# Patient Record
Sex: Female | Born: 1990 | Race: White | Hispanic: No | Marital: Married | Smoking: Never smoker
Health system: Southern US, Community
[De-identification: ages and names within clinical notes are randomized; demographics above are authoritative.]

## PROBLEM LIST (undated history)

## (undated) ENCOUNTER — Inpatient Hospital Stay (HOSPITAL_COMMUNITY): Payer: Self-pay

## (undated) DIAGNOSIS — Z789 Other specified health status: Secondary | ICD-10-CM

---

## 2016-11-24 NOTE — L&D Delivery Note (Signed)
Delivery Note At 4:26 AM a viable female was delivered via VBAC, Spontaneous (Presentation: OL ).  APGAR: 9, 9; weight pending .   Placenta status: Delivered intact with gentle traction.  3 vessels cord with the following complications: None .  Cord pH: not collected  Anesthesia:  Epidural Episiotomy:  None  Lacerations:  2nd degree  Suture Repair: 3.0 Monocryl  Est. Blood Loss (mL): 250 ml  Mom to postpartum.  Baby to Couplet care / Skin to Skin.  Abdoulaye Diallo 10/11/2017, 4:59 AM  I confirm that I have verified the information documented in the resident's note and that I have also personally reperformed the physical exam and all medical decision making activities.   I was gloved and present for entire delivery SVD without incident No difficulty with shoulders Lacerations as listed above Repair of same supervised by me Aviva SignsMarie L Shayne Deerman, CNM

## 2017-03-05 ENCOUNTER — Other Ambulatory Visit (HOSPITAL_COMMUNITY): Payer: Self-pay | Admitting: Nurse Practitioner

## 2017-03-05 DIAGNOSIS — Z8279 Family history of other congenital malformations, deformations and chromosomal abnormalities: Secondary | ICD-10-CM

## 2017-03-05 DIAGNOSIS — Z3481 Encounter for supervision of other normal pregnancy, first trimester: Secondary | ICD-10-CM

## 2017-03-05 LAB — OB RESULTS CONSOLE RPR: RPR: NONREACTIVE

## 2017-03-05 LAB — OB RESULTS CONSOLE GC/CHLAMYDIA
Chlamydia: NEGATIVE
Gonorrhea: NEGATIVE

## 2017-03-05 LAB — OB RESULTS CONSOLE ABO/RH: RH TYPE: POSITIVE

## 2017-03-05 LAB — OB RESULTS CONSOLE HIV ANTIBODY (ROUTINE TESTING): HIV: NONREACTIVE

## 2017-03-05 LAB — OB RESULTS CONSOLE HEPATITIS B SURFACE ANTIGEN: HEP B S AG: NEGATIVE

## 2017-03-05 LAB — OB RESULTS CONSOLE RUBELLA ANTIBODY, IGM: Rubella: IMMUNE

## 2017-03-05 LAB — OB RESULTS CONSOLE ANTIBODY SCREEN: Antibody Screen: NEGATIVE

## 2017-03-24 ENCOUNTER — Encounter (HOSPITAL_COMMUNITY): Payer: Self-pay | Admitting: *Deleted

## 2017-03-25 ENCOUNTER — Encounter (HOSPITAL_COMMUNITY): Payer: Self-pay

## 2017-03-25 ENCOUNTER — Other Ambulatory Visit (HOSPITAL_COMMUNITY): Payer: Self-pay | Admitting: Nurse Practitioner

## 2017-03-25 ENCOUNTER — Ambulatory Visit (HOSPITAL_COMMUNITY)
Admission: RE | Admit: 2017-03-25 | Discharge: 2017-03-25 | Disposition: A | Discharge status: Home / Self Care | Payer: Medicaid Other | Source: Ambulatory Visit | Attending: Nurse Practitioner | Admitting: Nurse Practitioner

## 2017-03-25 DIAGNOSIS — Z3A12 12 weeks gestation of pregnancy: Secondary | ICD-10-CM | POA: Insufficient documentation

## 2017-03-25 DIAGNOSIS — Z8279 Family history of other congenital malformations, deformations and chromosomal abnormalities: Secondary | ICD-10-CM

## 2017-03-25 DIAGNOSIS — Z3682 Encounter for antenatal screening for nuchal translucency: Secondary | ICD-10-CM

## 2017-03-25 DIAGNOSIS — O352XX0 Maternal care for (suspected) hereditary disease in fetus, not applicable or unspecified: Secondary | ICD-10-CM

## 2017-03-25 DIAGNOSIS — Z3481 Encounter for supervision of other normal pregnancy, first trimester: Secondary | ICD-10-CM | POA: Insufficient documentation

## 2017-03-25 HISTORY — DX: Other specified health status: Z78.9

## 2017-03-25 NOTE — Progress Notes (Addendum)
Genetic Counseling  Visit Summary Note  Appointment Date: 03/25/2017 Referred By: Cindy Click, NP  Date of Birth: 1990-12-08  Pregnancy history: G2P1001 Estimated Date of Delivery: 10/03/17 Estimated Gestational Age: [redacted]w[redacted]d I met with Ms. Cindy Mccullough, Cindy Mccullough for genetic counseling because of a family history of a congenital heart defect.  In summary:  Discussed family history of CHD  Reviewed multifactorial inheritance  Discussed options of screening / testing  Fetal echo to be scheduled  Anatomy ultrasound scheduled  Discussed general population carrier screening options  CF - previously performed  SMA - declined  Hemoglobinopathies - previously performed  We began by reviewing the family history in detail. Ms. Cindy Keysreported that her maternal half sister was born with a heart defect and had surgery when she was a few months old.  She also had something white on her eye and had surgery when she was 86942years of age, but cannot see out of that eye.  Ms. Cindy Keyswas unclear exactly what was different about her sister's eye or if it was congenital.  There is no other family history of a congenital heart defect (CHD) or other eye conditions.  We reviewed that CHDs can be isolated or a feature of an underlying genetic condition. Isolated CHDs are most often multifactorial in etiology, but can also result from chromosome aberrations, single gene conditions, or teratogenic exposures. We discussed that isolated, nonsyndromic CHDs occur in approximately 1% of the general population.  If Ms. Cindy Mccullough's half-sister had an isolated CHD, the risk of recurrence is expected to be no greater than 1-2%. If however, her half-sister had an underlying genetic condition that caused the CHD, the risk of recurrence could be increased. In this case, the specific chance of recurrence depends upon the inheritance of the condition. Without further information,  an accurate risk assessment cannot be provided. We discussed the availability of a detailed anatomy ultrasound and fetal echocardiogram to assess the development of the heart during the pregnancy.  These appointments will be scheduled.   The family histories were otherwise found to be noncontributory for birth defects, mental retardation, and known genetic conditions. Without further information regarding the provided family history, an accurate genetic risk cannot be calculated. Further genetic counseling is warranted if more information is obtained.  Ms. PShamarie Callwas provided with written information regarding cystic fibrosis (CF), spinal muscular atrophy (SMA) and hemoglobinopathies including the carrier frequency, availability of carrier screening and prenatal diagnosis if indicated.  In addition, we discussed that CF, SMA and hemoglobinopathies are routinely screened for as part of the [facility] newborn screening panel.  She was counseled that she has previously undergone screening for CF and hemoglobin disorders and it was reported to be within normal limites.  She declined screening for SMA today.  Ms. Cindy Keysdenied exposure to environmental toxins or chemical agents. She denied the use of alcohol, tobacco or street drugs. She denied significant viral illnesses during the course of her pregnancy. Her medical and surgical histories were noncontributory.   I counseled this couple regarding the above risks and available options.  The approximate face-to-face time with the genetic counselor was 30 minutes.  DCam Hai MS Certified Genetic Counselor

## 2017-03-26 ENCOUNTER — Other Ambulatory Visit (HOSPITAL_COMMUNITY): Payer: Self-pay | Admitting: *Deleted

## 2017-03-26 DIAGNOSIS — Z8279 Family history of other congenital malformations, deformations and chromosomal abnormalities: Secondary | ICD-10-CM

## 2017-03-27 ENCOUNTER — Other Ambulatory Visit: Payer: Self-pay

## 2017-05-07 ENCOUNTER — Encounter (HOSPITAL_COMMUNITY): Payer: Self-pay

## 2017-05-11 ENCOUNTER — Ambulatory Visit (HOSPITAL_COMMUNITY)
Admission: RE | Admit: 2017-05-11 | Discharge: 2017-05-11 | Disposition: A | Discharge status: Home / Self Care | Payer: Medicaid Other | Source: Ambulatory Visit | Attending: Nurse Practitioner | Admitting: Nurse Practitioner

## 2017-05-11 ENCOUNTER — Encounter (HOSPITAL_COMMUNITY): Payer: Self-pay

## 2017-05-11 DIAGNOSIS — Z3A19 19 weeks gestation of pregnancy: Secondary | ICD-10-CM | POA: Diagnosis not present

## 2017-05-11 DIAGNOSIS — Z8279 Family history of other congenital malformations, deformations and chromosomal abnormalities: Secondary | ICD-10-CM | POA: Diagnosis not present

## 2017-06-04 ENCOUNTER — Encounter (HOSPITAL_COMMUNITY): Payer: Self-pay

## 2017-07-27 ENCOUNTER — Inpatient Hospital Stay (HOSPITAL_COMMUNITY)
Admission: AD | Admit: 2017-07-27 | Discharge: 2017-07-28 | Disposition: A | Discharge status: Home / Self Care | Payer: Medicaid Other | Source: Ambulatory Visit | Attending: Obstetrics & Gynecology | Admitting: Obstetrics & Gynecology

## 2017-07-27 ENCOUNTER — Encounter (HOSPITAL_COMMUNITY): Payer: Self-pay

## 2017-07-27 DIAGNOSIS — R102 Pelvic and perineal pain: Secondary | ICD-10-CM | POA: Insufficient documentation

## 2017-07-27 DIAGNOSIS — N949 Unspecified condition associated with female genital organs and menstrual cycle: Secondary | ICD-10-CM

## 2017-07-27 DIAGNOSIS — R1030 Lower abdominal pain, unspecified: Secondary | ICD-10-CM | POA: Diagnosis not present

## 2017-07-27 DIAGNOSIS — O26893 Other specified pregnancy related conditions, third trimester: Secondary | ICD-10-CM | POA: Insufficient documentation

## 2017-07-27 DIAGNOSIS — R109 Unspecified abdominal pain: Secondary | ICD-10-CM | POA: Diagnosis not present

## 2017-07-27 DIAGNOSIS — Z3A30 30 weeks gestation of pregnancy: Secondary | ICD-10-CM | POA: Diagnosis not present

## 2017-07-27 DIAGNOSIS — O26899 Other specified pregnancy related conditions, unspecified trimester: Secondary | ICD-10-CM

## 2017-07-27 LAB — URINALYSIS, ROUTINE W REFLEX MICROSCOPIC
BILIRUBIN URINE: NEGATIVE
GLUCOSE, UA: NEGATIVE mg/dL
HGB URINE DIPSTICK: NEGATIVE
KETONES UR: NEGATIVE mg/dL
LEUKOCYTES UA: NEGATIVE
Nitrite: NEGATIVE
PH: 6 (ref 5.0–8.0)
Protein, ur: NEGATIVE mg/dL
Specific Gravity, Urine: 1.003 — ABNORMAL LOW (ref 1.005–1.030)

## 2017-07-27 MED ORDER — ACETAMINOPHEN 500 MG PO TABS
500.0000 mg | ORAL_TABLET | Freq: Once | ORAL | Status: AC
  Administered 2017-07-27: 500 mg via ORAL
  Filled 2017-07-27: qty 1

## 2017-07-27 NOTE — MAU Note (Signed)
Pt states 1 day ago while she was sleeping she "felt like something exploded" in left side of stomach. States that pain went away but tonight it happened again. Pt states she has the pain and it "feels like something is scratching on the inside." Pt reports good fetal movement. Pt denies vaginal bleeding or discharge.

## 2017-07-27 NOTE — MAU Provider Note (Signed)
Chief Complaint:  Abdominal Pain   None     HPI: Cindy Mccullough is a 26 y.o. G2P1001 at 3341w2d who presents to MAU reporting bilateral lower abdominal pain with L>R. Patient states that it feels like something on the inside is bursting open and hurts. She felt a pop two nights ago and then again today. Pain last for about 2 minutes. When it came back tonight it was after she sneezed and has been hurting ever since. Will eventually go away. She has not tried anything for pain. Pain currently 5/10. Pain described as sharp and stabbing. Does have some radiation to flank area.   Denies contractions, leakage of fluid, vaginal discharge, or vaginal bleeding. Good fetal movement.   Pregnancy Course:  [facility]  Past Medical History: Past Medical History:  Diagnosis Date  . Medical history non-contributory     Past obstetric history: OB History  Gravida Para Term Preterm AB Living  2 1 1     1   SAB TAB Ectopic Multiple Live Births               # Outcome Date GA Lbr Len/2nd Weight Sex Delivery Anes PTL Lv  2 Current           1 Term               Past Surgical History: Past Surgical History:  Procedure Laterality Date  . CESAREAN SECTION       Family History: No family history on file.  Social History: Social History  Substance Use Topics  . Smoking status: Never Smoker  . Smokeless tobacco: Never Used  . Alcohol use No    Allergies:  Allergies  Allergen Reactions  . Soy Allergy Anaphylaxis    Difficulty breathing and eyes swell    Meds:  Prescriptions Prior to Admission  Medication Sig Dispense Refill Last Dose  . Prenatal Vit-Fe Fumarate-FA (PRENATAL MULTIVITAMIN) TABS tablet Take 1 tablet by mouth daily at 12 noon.   Taking    I have reviewed patient's Past Medical Hx, Surgical Hx, Family Hx, Social Hx, medications and allergies.   ROS:  All systems reviewed and are negative for acute change except as noted in the  HPI.   Physical Exam  Patient Vitals for the past 24 hrs:  BP Temp Temp src Pulse Resp SpO2 Height Weight  07/27/17 2223 134/82 98.2 F (36.8 C) Oral 88 19 98 % 5\' 3"  (1.6 m) 218 lb (98.9 kg)   Constitutional: Well-developed, well-nourished female in no acute distress.  Cardiovascular: normal rate and rhythm Respiratory: normal effort GI: Abd soft, non-tender, gravid appropriate for gestational age. MS: Extremities nontender, no edema, normal ROM Neurologic: Alert and oriented x 4.  GU: Neg CVAT. Pelvic: defered   Labs: Results for orders placed or performed during the hospital encounter of 07/27/17 (from the past 72 hour(s))  Urinalysis, Routine w reflex microscopic     Status: Abnormal   Collection Time: 07/27/17 10:19 PM  Result Value Ref Range   Color, Urine STRAW (A) YELLOW   APPearance CLEAR CLEAR   Specific Gravity, Urine 1.003 (L) 1.005 - 1.030   pH 6.0 5.0 - 8.0   Glucose, UA NEGATIVE NEGATIVE mg/dL   Hgb urine dipstick NEGATIVE NEGATIVE   Bilirubin Urine NEGATIVE NEGATIVE   Ketones, ur NEGATIVE NEGATIVE mg/dL   Protein, ur NEGATIVE NEGATIVE mg/dL   Nitrite NEGATIVE NEGATIVE   Leukocytes, UA NEGATIVE NEGATIVE  Imaging:  No results found.  MAU Course: Vitals and nursing note reviewed  UA unremarkable Tylenol given for pain  I personally reviewed the patient's NST today, found to be REACTIVE. 135 bpm, mod var, +accels, no decels. CTX: none   MDM: Plan of care reviewed with patient, including labs and tests ordered and medical treatment.   Assessment: 1. Round ligament pain   2. Abdominal pain affecting pregnancy     Plan: Discharge home in stable condition.  Instructions on RLP given Tylenol prn for pain Rx for pregnancy support band given Preterm labor precautions and fetal kick counts Follow-up with OB provider   Caryl Ada, DO [facility] for [facility], [facility] 07/27/2017 10:41 PM

## 2017-07-27 NOTE — Discharge Instructions (Signed)

## 2017-07-30 LAB — OB RESULTS CONSOLE RPR: RPR: REACTIVE

## 2017-08-04 ENCOUNTER — Encounter (HOSPITAL_COMMUNITY): Payer: Self-pay

## 2017-09-10 LAB — OB RESULTS CONSOLE GC/CHLAMYDIA
Chlamydia: NEGATIVE
GC PROBE AMP, GENITAL: NEGATIVE

## 2017-09-10 LAB — OB RESULTS CONSOLE GBS: GBS: NEGATIVE

## 2017-09-10 LAB — OB RESULTS CONSOLE RPR: RPR: REACTIVE

## 2017-10-06 ENCOUNTER — Encounter (HOSPITAL_COMMUNITY): Payer: Self-pay | Admitting: *Deleted

## 2017-10-06 ENCOUNTER — Telehealth (HOSPITAL_COMMUNITY): Payer: Self-pay | Admitting: *Deleted

## 2017-10-06 NOTE — Telephone Encounter (Signed)
Preadmission screen [phone] interpreter number

## 2017-10-10 ENCOUNTER — Other Ambulatory Visit: Payer: Self-pay

## 2017-10-10 ENCOUNTER — Inpatient Hospital Stay (HOSPITAL_COMMUNITY)
Admission: RE | Admit: 2017-10-10 | Discharge: 2017-10-13 | DRG: 798 | Disposition: A | Discharge status: Home / Self Care | Payer: Medicaid Other | Source: Ambulatory Visit | Attending: Obstetrics and Gynecology | Admitting: Obstetrics and Gynecology

## 2017-10-10 ENCOUNTER — Encounter (HOSPITAL_COMMUNITY): Payer: Self-pay

## 2017-10-10 ENCOUNTER — Inpatient Hospital Stay (HOSPITAL_COMMUNITY): Payer: Medicaid Other | Admitting: Anesthesiology

## 2017-10-10 ENCOUNTER — Encounter (HOSPITAL_COMMUNITY): Payer: Self-pay | Admitting: Anesthesiology

## 2017-10-10 DIAGNOSIS — Z3A41 41 weeks gestation of pregnancy: Secondary | ICD-10-CM | POA: Diagnosis not present

## 2017-10-10 DIAGNOSIS — Z302 Encounter for sterilization: Secondary | ICD-10-CM

## 2017-10-10 DIAGNOSIS — O34219 Maternal care for unspecified type scar from previous cesarean delivery: Secondary | ICD-10-CM | POA: Diagnosis present

## 2017-10-10 DIAGNOSIS — O99214 Obesity complicating childbirth: Secondary | ICD-10-CM | POA: Diagnosis present

## 2017-10-10 DIAGNOSIS — O480 Post-term pregnancy: Principal | ICD-10-CM | POA: Diagnosis present

## 2017-10-10 LAB — TYPE AND SCREEN
ABO/RH(D): B POS
ANTIBODY SCREEN: NEGATIVE

## 2017-10-10 LAB — CBC
HEMATOCRIT: 36.9 % (ref 36.0–46.0)
HEMOGLOBIN: 12.2 g/dL (ref 12.0–15.0)
MCH: 28.8 pg (ref 26.0–34.0)
MCHC: 33.1 g/dL (ref 30.0–36.0)
MCV: 87 fL (ref 78.0–100.0)
Platelets: 258 10*3/uL (ref 150–400)
RBC: 4.24 MIL/uL (ref 3.87–5.11)
RDW: 14.3 % (ref 11.5–15.5)
WBC: 7.1 10*3/uL (ref 4.0–10.5)

## 2017-10-10 LAB — ABO/RH: ABO/RH(D): B POS

## 2017-10-10 MED ORDER — LACTATED RINGERS IV SOLN
500.0000 mL | Freq: Once | INTRAVENOUS | Status: AC
  Administered 2017-10-11: 500 mL via INTRAVENOUS

## 2017-10-10 MED ORDER — PHENYLEPHRINE 40 MCG/ML (10ML) SYRINGE FOR IV PUSH (FOR BLOOD PRESSURE SUPPORT)
80.0000 ug | PREFILLED_SYRINGE | INTRAVENOUS | Status: DC | PRN
  Filled 2017-10-10: qty 10
  Filled 2017-10-10: qty 5

## 2017-10-10 MED ORDER — FENTANYL CITRATE (PF) 100 MCG/2ML IJ SOLN
INTRAMUSCULAR | Status: AC
  Filled 2017-10-10: qty 2

## 2017-10-10 MED ORDER — TERBUTALINE SULFATE 1 MG/ML IJ SOLN
0.2500 mg | Freq: Once | INTRAMUSCULAR | Status: DC | PRN
  Filled 2017-10-10: qty 1

## 2017-10-10 MED ORDER — OXYCODONE-ACETAMINOPHEN 5-325 MG PO TABS
2.0000 | ORAL_TABLET | ORAL | Status: DC | PRN
  Administered 2017-10-12: 2 via ORAL
  Filled 2017-10-10: qty 2

## 2017-10-10 MED ORDER — ONDANSETRON HCL 4 MG/2ML IJ SOLN: 4.0000 mg | Freq: Four times a day (QID) | INTRAMUSCULAR | Status: DC | PRN

## 2017-10-10 MED ORDER — DIPHENHYDRAMINE HCL 50 MG/ML IJ SOLN: 12.5000 mg | INTRAMUSCULAR | Status: DC | PRN

## 2017-10-10 MED ORDER — OXYTOCIN BOLUS FROM INFUSION
500.0000 mL | Freq: Once | INTRAVENOUS | Status: AC
  Administered 2017-10-11: 500 mL via INTRAVENOUS

## 2017-10-10 MED ORDER — OXYTOCIN 40 UNITS IN LACTATED RINGERS INFUSION - SIMPLE MED
1.0000 m[IU]/min | INTRAVENOUS | Status: DC
  Administered 2017-10-10: 1 m[IU]/min via INTRAVENOUS
  Filled 2017-10-10: qty 1000

## 2017-10-10 MED ORDER — OXYTOCIN 40 UNITS IN LACTATED RINGERS INFUSION - SIMPLE MED: 2.5000 [IU]/h | INTRAVENOUS | Status: DC

## 2017-10-10 MED ORDER — LACTATED RINGERS IV SOLN: 500.0000 mL | INTRAVENOUS | Status: DC | PRN

## 2017-10-10 MED ORDER — LACTATED RINGERS IV SOLN
INTRAVENOUS | Status: DC
  Administered 2017-10-10 (×3): via INTRAVENOUS

## 2017-10-10 MED ORDER — FENTANYL CITRATE (PF) 100 MCG/2ML IJ SOLN
100.0000 ug | INTRAMUSCULAR | Status: DC | PRN
  Administered 2017-10-10 (×3): 100 ug via INTRAVENOUS
  Filled 2017-10-10 (×2): qty 2

## 2017-10-10 MED ORDER — LIDOCAINE HCL (PF) 1 % IJ SOLN
INTRAMUSCULAR | Status: DC | PRN
  Administered 2017-10-10: 4 mL via EPIDURAL

## 2017-10-10 MED ORDER — EPHEDRINE 5 MG/ML INJ
10.0000 mg | INTRAVENOUS | Status: DC | PRN
  Filled 2017-10-10: qty 2

## 2017-10-10 MED ORDER — LIDOCAINE HCL (PF) 1 % IJ SOLN
30.0000 mL | INTRAMUSCULAR | Status: DC | PRN
  Filled 2017-10-10: qty 30

## 2017-10-10 MED ORDER — SOD CITRATE-CITRIC ACID 500-334 MG/5ML PO SOLN: 30.0000 mL | ORAL | Status: DC | PRN

## 2017-10-10 MED ORDER — LACTATED RINGERS IV SOLN
500.0000 mL | Freq: Once | INTRAVENOUS | Status: AC
  Administered 2017-10-10: 500 mL via INTRAVENOUS

## 2017-10-10 MED ORDER — ACETAMINOPHEN 325 MG PO TABS
650.0000 mg | ORAL_TABLET | ORAL | Status: DC | PRN
  Filled 2017-10-10: qty 2

## 2017-10-10 MED ORDER — PHENYLEPHRINE 40 MCG/ML (10ML) SYRINGE FOR IV PUSH (FOR BLOOD PRESSURE SUPPORT)
80.0000 ug | PREFILLED_SYRINGE | INTRAVENOUS | Status: DC | PRN
  Filled 2017-10-10: qty 5

## 2017-10-10 MED ORDER — FENTANYL 2.5 MCG/ML BUPIVACAINE 1/10 % EPIDURAL INFUSION (WH - ANES)
14.0000 mL/h | INTRAMUSCULAR | Status: DC | PRN
  Administered 2017-10-10 – 2017-10-11 (×2): 14 mL/h via EPIDURAL
  Filled 2017-10-10 (×2): qty 100

## 2017-10-10 MED ORDER — OXYCODONE-ACETAMINOPHEN 5-325 MG PO TABS: 1.0000 | ORAL_TABLET | ORAL | Status: DC | PRN

## 2017-10-10 NOTE — Anesthesia Pain Management Evaluation Note (Signed)
  CRNA Pain Management Visit Note  Patient: Cindy Mccullough, 26 y.o., female  "Hello I am a [facility] of the anesthesia [facility]. We have an anesthesia team available at all times to provide care throughout the hospital, including epidural management and anesthesia for C-section. I don't know your plan for the delivery whether it a natural birth, water birth, IV sedation, nitrous supplementation, doula or epidural, but we want to meet your pain goals."   1.Was your pain managed to your expectations on prior hospitalizations?   Yes   2.What is your expectation for pain management during this hospitalization?     Epidural  3.How can we help you reach that goal? epidural  Record the patient's initial score and the patient's pain goal.   Pain: 0  Pain Goal: 4 [facility] wants you to be able to say your pain was always managed very well.  Terah Robey 10/10/2017

## 2017-10-10 NOTE — Progress Notes (Addendum)
Patient ID: [name], female   DOB: August 10, 1991, 26 y.o.   MRN: [id number] Getting epidural due to not tolerating contractions Foley reportedly still in place  Vitals:   10/10/17 2048 10/10/17 2055 10/10/17 2102 10/10/17 2106  BP: 117/69 118/73 117/73 118/75  Pulse: 78 84 84 77  Resp: 20 20 18 18   Temp:      TempSrc:      SpO2: 99% 99%    Weight:      Height:       FHR stable but non reactive at present UCs every 3 min  Dilation: 1.5 Effacement (%): Thick Cervical Position: Posterior Station: -3 Presentation: Vertex Exam by:: Zerita Boersarlene Lawson, CNM  Will recheck after epidural Consider AROM Is on  Pitocin

## 2017-10-10 NOTE — Anesthesia Procedure Notes (Signed)
Epidural Patient location during procedure: OB Start time: 10/10/2017 8:45 PM End time: 10/10/2017 8:51 PM  Staffing Anesthesiologist: Shelton SilvasHollis, Cydney Alvarenga D, MD Performed: anesthesiologist   Preanesthetic Checklist Completed: patient identified, site marked, surgical consent, pre-op evaluation, timeout performed, IV checked, risks and benefits discussed and monitors and equipment checked  Epidural Patient position: sitting Prep: ChloraPrep Patient monitoring: heart rate, continuous pulse ox and blood pressure Approach: midline Location: L3-L4 Injection technique: LOR saline  Needle:  Needle type: Tuohy  Needle gauge: 17 G Needle length: 9 cm Catheter type: closed end flexible Catheter size: 20 Guage Test dose: negative and 1.5% lidocaine  Assessment Events: blood not aspirated, injection not painful, no injection resistance and no paresthesia  Additional Notes LOR @ 6  Patient identified. Risks/Benefits/Options discussed with patient including but not limited to bleeding, infection, nerve damage, paralysis, failed block, incomplete pain control, headache, blood pressure changes, nausea, vomiting, reactions to medications, itching and postpartum back pain. Confirmed with bedside nurse the patient's most recent platelet count. Confirmed with patient that they are not currently taking any anticoagulation, have any bleeding history or any family history of bleeding disorders. Patient expressed understanding and wished to proceed. All questions were answered. Sterile technique was used throughout the entire procedure. Please see nursing notes for vital signs. Test dose was given through epidural catheter and negative prior to continuing to dose epidural or start infusion. Warning signs of high block given to the patient including shortness of breath, tingling/numbness in hands, complete motor block, or any concerning symptoms with instructions to call for help. Patient was given instructions  on fall risk and not to get out of bed. All questions and concerns addressed with instructions to call with any issues or inadequate analgesia.    Reason for block:procedure for pain

## 2017-10-10 NOTE — Anesthesia Preprocedure Evaluation (Signed)
Anesthesia Evaluation  Patient identified by MRN, date of birth, ID band Patient awake    Reviewed: Allergy & Precautions, Patient's Chart, lab work & pertinent test results  Airway Mallampati: II       Dental  (+) Teeth Intact   Pulmonary neg pulmonary ROS,    breath sounds clear to auscultation       Cardiovascular negative cardio ROS   Rhythm:Regular Rate:Normal     Neuro/Psych negative neurological ROS     GI/Hepatic negative GI ROS, Neg liver ROS,   Endo/Other  negative endocrine ROS  Renal/GU negative Renal ROS     Musculoskeletal negative musculoskeletal ROS (+)   Abdominal   Peds  Hematology negative hematology ROS (+)   Anesthesia Other Findings Day of surgery medications reviewed with the patient.  Reproductive/Obstetrics (+) Pregnancy                             Lab Results  Component Value Date   WBC 7.1 10/10/2017   HGB 12.2 10/10/2017   HCT 36.9 10/10/2017   MCV 87.0 10/10/2017   PLT 258 10/10/2017     Anesthesia Physical Anesthesia Plan  ASA: III  Anesthesia Plan: Epidural   Post-op Pain Management:    Induction:   PONV Risk Score and Plan:   Airway Management Planned:   Additional Equipment:   Intra-op Plan:   Post-operative Plan:   Informed Consent: I have reviewed the patients History and Physical, chart, labs and discussed the procedure including the risks, benefits and alternatives for the proposed anesthesia with the patient or authorized representative who has indicated his/her understanding and acceptance.     Plan Discussed with:   Anesthesia Plan Comments:         Anesthesia Quick Evaluation

## 2017-10-10 NOTE — H&P (Signed)
Cindy Mccullough is a 26 y.o. female G2P1001 with TOLAC at 6974w0d dated by US presenting for IOL. She has signed TOLAC consent form Pt states she has been having no contractions, spotting, intact, with active fetal movement.  She desires to bilateral tubal ligation -- papers signed.   [facility]  Patient Active Problem List   Diagnosis Date Noted  . Indication for care in labor or delivery 10/10/2017  . [redacted] weeks gestation of pregnancy   . Hereditary disease in family possibly affecting fetus, affecting management of mother, antepartum condition or complication, not applicable or unspecified fetus     Prenatal History/Complications:  Past Medical History: Past Medical History:  Diagnosis Date  . Medical history non-contributory     Past Surgical History: Past Surgical History:  Procedure Laterality Date  . CESAREAN SECTION      Obstetrical History: OB History    Gravida Para Term Preterm AB Living   2 1 1     1    SAB TAB Ectopic Multiple Live Births           1      Gynecological History: Prior C-section  Social History: Social History   Socioeconomic History  . Marital status: Married    Spouse name: None  . Number of children: None  . Years of education: None  . Highest education level: None  Social Needs  . Financial resource strain: None  . Food insecurity - worry: None  . Food insecurity - inability: None  . Transportation needs - medical: None  . Transportation needs - non-medical: None  Occupational History  . None  Tobacco Use  . Smoking status: Never Smoker  . Smokeless tobacco: Never Used  Substance and Sexual Activity  . Alcohol use: No  . Drug use: No  . Sexual activity: Yes    Birth control/protection: None  Other Topics Concern  . None  Social History Narrative  . None    Family History: Family History  Problem Relation Age of Onset  . Heart defect Sister     Allergies: Allergies  Allergen Reactions  . Soy Allergy  Anaphylaxis    Difficulty breathing and eyes swell    Medications Prior to Admission  Medication Sig Dispense Refill Last Dose  . Prenatal Vit-Fe Fumarate-FA (PRENATAL MULTIVITAMIN) TABS tablet Take 1 tablet by mouth daily at 12 noon.   10/09/2017 at Unknown time    Review of Systems - General ROS: negative Respiratory ROS: no cough, shortness of breath, or wheezing Cardiovascular ROS: no chest pain or dyspnea on exertion Neurological ROS: negative, ocassional visual changes if turning head fast but this is chronic  Patient Vitals for the past 24 hrs:  BP Temp Temp src Pulse Resp Height Weight  10/10/17 1016 - - - - - 5\' 3"  (1.6 m) 227 lb (103 kg)  10/10/17 1013 103/67 - - 76 18 - -  10/10/17 0834 116/72 98.2 F (36.8 C) Oral 81 16 - -   Physical Exam  General appearance: alert and cooperative Lungs: clear to auscultation bilaterally Heart: regular rate and rhythm, S1, S2 normal, no murmur, click, rub or gallop Abdomen: normal findings: . Neurologic: Grossly normal reflexes 2+ cephalic Baseline: 145 bpm None felt per patient Dilation: 1 Effacement (%): Thick Station: -3 Exam by:: Arne ClevelandJennifer Hazelwood, RN  Prenatal labs: ABO, Rh: B/Positive/-- (04/12 0000) Antibody: Negative (04/12 0000) Rubella: Immune (04/12 0000) RPR: Reactive (10/18 0000)  HBsAg: Negative (04/12 0000)  HIV: Non-reactive (04/12 0000)  GBS: Negative (10/18 0000)  1 hr Glucola: Normal Genetic screening: Normal Anatomy US: Normal  Assessment: 1. Labor: 1/thick/-3 2. Fetal Wellbeing: Category 1  3. Pain Control: well controlled 4. GBS: Negative 5. 41 week IUP  Plan:  1. Admit to BS per consult with MD 2. Routine L&D orders 3. Analgesia/anesthesia PRN   Marthenia RollingBland, Scott, DO 10/10/2017, 10:54 AM   I confirm that I have verified the information documented in the resident's note and that I have also personally reperformed the physical exam and all medical decision making activities.   [name], CNM  10/10/2017 12:02 PM

## 2017-10-11 ENCOUNTER — Inpatient Hospital Stay (HOSPITAL_COMMUNITY): Payer: Medicaid Other | Admitting: Certified Registered Nurse Anesthetist

## 2017-10-11 ENCOUNTER — Encounter (HOSPITAL_COMMUNITY)
Admission: RE | Disposition: A | Discharge status: Home / Self Care | Payer: Self-pay | Source: Ambulatory Visit | Attending: Obstetrics and Gynecology

## 2017-10-11 ENCOUNTER — Encounter (HOSPITAL_COMMUNITY): Payer: Self-pay

## 2017-10-11 DIAGNOSIS — Z3A41 41 weeks gestation of pregnancy: Secondary | ICD-10-CM

## 2017-10-11 DIAGNOSIS — Z302 Encounter for sterilization: Secondary | ICD-10-CM

## 2017-10-11 HISTORY — PX: TUBAL LIGATION: SHX77

## 2017-10-11 SURGERY — LIGATION, FALLOPIAN TUBE, POSTPARTUM
Anesthesia: Epidural | Site: Abdomen | Laterality: Bilateral | Wound class: Clean Contaminated

## 2017-10-11 MED ORDER — BENZOCAINE-MENTHOL 20-0.5 % EX AERO: 1.0000 "application " | INHALATION_SPRAY | CUTANEOUS | Status: DC | PRN

## 2017-10-11 MED ORDER — LIDOCAINE-EPINEPHRINE 2 %-1:100000 IJ SOLN: INTRAMUSCULAR | Status: DC | PRN

## 2017-10-11 MED ORDER — ONDANSETRON HCL 4 MG/2ML IJ SOLN: 4.0000 mg | INTRAMUSCULAR | Status: DC | PRN

## 2017-10-11 MED ORDER — BUPIVACAINE HCL 0.25 % IJ SOLN
INTRAMUSCULAR | Status: DC | PRN
  Administered 2017-10-11: 10 mL

## 2017-10-11 MED ORDER — SODIUM BICARBONATE 8.4 % IV SOLN
INTRAVENOUS | Status: DC | PRN
  Administered 2017-10-11 (×4): 5 mL via EPIDURAL

## 2017-10-11 MED ORDER — METOCLOPRAMIDE HCL 10 MG PO TABS
10.0000 mg | ORAL_TABLET | Freq: Once | ORAL | Status: AC
  Administered 2017-10-11: 10 mg via ORAL
  Filled 2017-10-11: qty 1

## 2017-10-11 MED ORDER — MIDAZOLAM HCL 2 MG/2ML IJ SOLN
INTRAMUSCULAR | Status: AC
  Filled 2017-10-11: qty 2

## 2017-10-11 MED ORDER — COCONUT OIL OIL
1.0000 "application " | TOPICAL_OIL | Status: DC | PRN
  Filled 2017-10-11: qty 120

## 2017-10-11 MED ORDER — ZOLPIDEM TARTRATE 5 MG PO TABS: 5.0000 mg | ORAL_TABLET | Freq: Every evening | ORAL | Status: DC | PRN

## 2017-10-11 MED ORDER — FENTANYL CITRATE (PF) 100 MCG/2ML IJ SOLN
INTRAMUSCULAR | Status: DC | PRN
  Administered 2017-10-11: 50 ug via INTRAVENOUS
  Administered 2017-10-11 (×2): 25 ug via INTRAVENOUS

## 2017-10-11 MED ORDER — ONDANSETRON HCL 4 MG PO TABS: 4.0000 mg | ORAL_TABLET | ORAL | Status: DC | PRN

## 2017-10-11 MED ORDER — HYDROMORPHONE HCL 1 MG/ML IJ SOLN
0.2500 mg | INTRAMUSCULAR | Status: DC | PRN
  Administered 2017-10-11: 0.5 mg via INTRAVENOUS

## 2017-10-11 MED ORDER — ONDANSETRON HCL 4 MG/2ML IJ SOLN
INTRAMUSCULAR | Status: AC
  Filled 2017-10-11: qty 2

## 2017-10-11 MED ORDER — FENTANYL CITRATE (PF) 100 MCG/2ML IJ SOLN
INTRAMUSCULAR | Status: AC
  Filled 2017-10-11: qty 2

## 2017-10-11 MED ORDER — DIBUCAINE 1 % RE OINT: 1.0000 "application " | TOPICAL_OINTMENT | RECTAL | Status: DC | PRN

## 2017-10-11 MED ORDER — LACTATED RINGERS IV SOLN
INTRAVENOUS | Status: DC | PRN
  Administered 2017-10-11: 11:00:00 via INTRAVENOUS

## 2017-10-11 MED ORDER — ACETAMINOPHEN 325 MG PO TABS
650.0000 mg | ORAL_TABLET | ORAL | Status: DC | PRN
  Administered 2017-10-11: 650 mg via ORAL

## 2017-10-11 MED ORDER — BUPIVACAINE HCL (PF) 0.25 % IJ SOLN
INTRAMUSCULAR | Status: AC
  Filled 2017-10-11: qty 30

## 2017-10-11 MED ORDER — PROMETHAZINE HCL 25 MG/ML IJ SOLN: 6.2500 mg | INTRAMUSCULAR | Status: DC | PRN

## 2017-10-11 MED ORDER — SODIUM CHLORIDE 0.9 % IR SOLN
Status: DC | PRN
  Administered 2017-10-11: 1

## 2017-10-11 MED ORDER — SIMETHICONE 80 MG PO CHEW: 80.0000 mg | CHEWABLE_TABLET | ORAL | Status: DC | PRN

## 2017-10-11 MED ORDER — MIDAZOLAM HCL 5 MG/5ML IJ SOLN
INTRAMUSCULAR | Status: DC | PRN
  Administered 2017-10-11 (×2): 1 mg via INTRAVENOUS

## 2017-10-11 MED ORDER — FAMOTIDINE 20 MG PO TABS
40.0000 mg | ORAL_TABLET | Freq: Once | ORAL | Status: AC
  Administered 2017-10-11: 40 mg via ORAL
  Filled 2017-10-11: qty 2

## 2017-10-11 MED ORDER — MEPERIDINE HCL 25 MG/ML IJ SOLN
6.2500 mg | INTRAMUSCULAR | Status: DC | PRN
Start: 2017-10-11 — End: 2017-10-11

## 2017-10-11 MED ORDER — DIPHENHYDRAMINE HCL 25 MG PO CAPS: 25.0000 mg | ORAL_CAPSULE | Freq: Four times a day (QID) | ORAL | Status: DC | PRN

## 2017-10-11 MED ORDER — BACITRACIN ZINC 500 UNIT/GM EX OINT
TOPICAL_OINTMENT | CUTANEOUS | Status: AC
  Filled 2017-10-11: qty 28.35

## 2017-10-11 MED ORDER — SENNOSIDES-DOCUSATE SODIUM 8.6-50 MG PO TABS
2.0000 | ORAL_TABLET | ORAL | Status: DC
  Administered 2017-10-11 – 2017-10-13 (×2): 2 via ORAL
  Filled 2017-10-11 (×2): qty 2

## 2017-10-11 MED ORDER — LACTATED RINGERS IV SOLN
INTRAVENOUS | Status: DC
  Administered 2017-10-11: 12:00:00 via INTRAVENOUS

## 2017-10-11 MED ORDER — TETANUS-DIPHTH-ACELL PERTUSSIS 5-2.5-18.5 LF-MCG/0.5 IM SUSP: 0.5000 mL | Freq: Once | INTRAMUSCULAR | Status: DC

## 2017-10-11 MED ORDER — KETOROLAC TROMETHAMINE 30 MG/ML IJ SOLN
INTRAMUSCULAR | Status: AC
  Administered 2017-10-11: 30 mg via INTRAMUSCULAR
  Filled 2017-10-11: qty 1

## 2017-10-11 MED ORDER — WITCH HAZEL-GLYCERIN EX PADS: 1.0000 "application " | MEDICATED_PAD | CUTANEOUS | Status: DC | PRN

## 2017-10-11 MED ORDER — HYDROMORPHONE HCL 1 MG/ML IJ SOLN
INTRAMUSCULAR | Status: AC
  Administered 2017-10-11: 0.5 mg via INTRAVENOUS
  Filled 2017-10-11: qty 0.5

## 2017-10-11 MED ORDER — LIDOCAINE-EPINEPHRINE (PF) 2 %-1:200000 IJ SOLN
INTRAMUSCULAR | Status: AC
  Filled 2017-10-11: qty 20

## 2017-10-11 MED ORDER — IBUPROFEN 600 MG PO TABS
600.0000 mg | ORAL_TABLET | Freq: Four times a day (QID) | ORAL | Status: DC
  Administered 2017-10-11 – 2017-10-13 (×7): 600 mg via ORAL
  Filled 2017-10-11 (×7): qty 1

## 2017-10-11 MED ORDER — KETOROLAC TROMETHAMINE 30 MG/ML IJ SOLN: 30.0000 mg | Freq: Once | INTRAMUSCULAR | Status: DC | PRN

## 2017-10-11 MED ORDER — ONDANSETRON HCL 4 MG/2ML IJ SOLN
INTRAMUSCULAR | Status: DC | PRN
Start: 2017-10-11 — End: 2017-10-11
  Administered 2017-10-11: 4 mg via INTRAVENOUS

## 2017-10-11 SURGICAL SUPPLY — 24 items
CLIP FILSHIE TUBAL LIGA STRL (Clip) ×3 IMPLANT
CLOTH BEACON ORANGE TIMEOUT ST (SAFETY) ×3 IMPLANT
CONTAINER PREFILL 10% NBF 15ML (MISCELLANEOUS) ×6 IMPLANT
DECANTER SPIKE VIAL GLASS SM (MISCELLANEOUS) ×3 IMPLANT
DRSG OPSITE POSTOP 3X4 (GAUZE/BANDAGES/DRESSINGS) ×3 IMPLANT
DURAPREP 26ML APPLICATOR (WOUND CARE) ×3 IMPLANT
GLOVE BIOGEL PI IND STRL 7.0 (GLOVE) ×1 IMPLANT
GLOVE BIOGEL PI IND STRL 9 (GLOVE) ×2 IMPLANT
GLOVE BIOGEL PI INDICATOR 7.0 (GLOVE) ×2
GLOVE BIOGEL PI INDICATOR 9 (GLOVE) ×4
GLOVE ECLIPSE 9.0 STRL (GLOVE) ×3 IMPLANT
GOWN STRL REUS W/TWL 2XL LVL3 (GOWN DISPOSABLE) ×6 IMPLANT
GOWN STRL REUS W/TWL LRG LVL3 (GOWN DISPOSABLE) ×3 IMPLANT
NEEDLE HYPO 22GX1.5 SAFETY (NEEDLE) ×3 IMPLANT
NS IRRIG 1000ML POUR BTL (IV SOLUTION) ×3 IMPLANT
PACK ABDOMINAL MINOR (CUSTOM PROCEDURE TRAY) ×3 IMPLANT
PROTECTOR NERVE ULNAR (MISCELLANEOUS) ×3 IMPLANT
SPONGE LAP 4X18 X RAY DECT (DISPOSABLE) IMPLANT
SUT VIC AB 0 CT1 27 (SUTURE)
SUT VIC AB 0 CT1 27XBRD ANBCTR (SUTURE) IMPLANT
SUT VIC AB 4-0 PS2 27 (SUTURE) ×3 IMPLANT
SYR CONTROL 10ML LL (SYRINGE) ×3 IMPLANT
TOWEL OR 17X24 6PK STRL BLUE (TOWEL DISPOSABLE) ×6 IMPLANT
TRAY FOLEY CATH SILVER 14FR (SET/KITS/TRAYS/PACK) IMPLANT

## 2017-10-11 NOTE — Anesthesia Postprocedure Evaluation (Signed)
Anesthesia Post Note  Patient: Cindy Mccullough  Procedure(s) Performed: POST PARTUM TUBAL LIGATION (Bilateral Abdomen)     Patient location during evaluation: PACU Anesthesia Type: Epidural Level of consciousness: awake Pain management: pain level controlled Vital Signs Assessment: post-procedure vital signs reviewed and stable Respiratory status: spontaneous breathing Cardiovascular status: stable Postop Assessment: no headache, no backache, epidural receding, no apparent nausea or vomiting and patient able to bend at knees Anesthetic complications: no    Last Vitals:  Vitals:   10/11/17 1645 10/11/17 2000  BP: (!) 110/59 (!) 101/45  Pulse: 76 72  Resp: 16 17  Temp: 37.2 C 36.8 C  SpO2: 99% 100%    Last Pain:  Vitals:   10/11/17 2000  TempSrc: Oral  PainSc: 3    Pain Goal: Patients Stated Pain Goal: 3 (10/11/17 1645)               [name],[name]

## 2017-10-11 NOTE — Lactation Note (Signed)
This note was copied from a baby's chart. Lactation Consultation Note  Patient Name: Cindy Mccullough ZOXWR'UToday's Date: 10/11/2017 Reason for consult: Initial assessment;Term Breastfeeding consultation services and support information given to patient.  Newborn is 6 hours old and she has been to the breast 3 times.  Mom is unsure if latch is correct because she feels some discomfort.  Instructed to call out for feeding assist when baby cues.  Maternal Data Does the patient have breastfeeding experience prior to this delivery?: Yes  Feeding Feeding Type: Breast Fed Length of feed: 10 min  LATCH Score Latch: Grasps breast easily, tongue down, lips flanged, rhythmical sucking.  Audible Swallowing: None  Type of Nipple: Everted at rest and after stimulation  Comfort (Breast/Nipple): Soft / non-tender  Hold (Positioning): Assistance needed to correctly position infant at breast and maintain latch.  LATCH Score: 7  Interventions Interventions: Breast feeding basics reviewed;Skin to skin;Assisted with latch(cautioned mom about using too many surrounding blankets)  Lactation Tools Discussed/Used     Consult Status Consult Status: Follow-up Date: 10/12/17 Follow-up type: In-patient    Huston FoleyMOULDEN, Garion Wempe S 10/11/2017, 10:40 AM

## 2017-10-11 NOTE — Anesthesia Postprocedure Evaluation (Signed)
Anesthesia Post Note  Patient: Cindy Mccullough  Procedure(s) Performed: AN AD HOC LABOR EPIDURAL     Patient location during evaluation: Mother Baby Anesthesia Type: Epidural Level of consciousness: awake Pain management: pain level controlled Vital Signs Assessment: post-procedure vital signs reviewed and stable Respiratory status: spontaneous breathing Cardiovascular status: stable Postop Assessment: no headache, no backache, epidural receding, patient able to bend at knees, no apparent nausea or vomiting and adequate PO intake Anesthetic complications: no    Last Vitals:  Vitals:   10/11/17 1310 10/11/17 1413  BP: (!) 96/57 (!) 109/55  Pulse: 75 76  Resp: 16 16  Temp: 36.9 C 37.2 C  SpO2: 99% 98%    Last Pain:  Vitals:   10/11/17 1413  TempSrc: Oral  PainSc: 4    Pain Goal: Patients Stated Pain Goal: 4 (10/11/17 1413)               Elisah Parmer

## 2017-10-11 NOTE — Progress Notes (Signed)
POSTPARTUM PROGRESS NOTE  Post Partum Day 1 Subjective:  Cindy Mccullough is a 26 y.o. [id number] 1931w1d s/p VBAC.  No acute events overnight.  Pt denies problems with ambulating, voiding or po intake.  She denies nausea or vomiting.  Pain is well controlled.   Lochia Minimal.   Objective: Blood pressure 92/60, pulse 69, temperature 97.9 F (36.6 C), temperature source Oral, resp. rate 18, height 5\' 3"  (1.6 m), weight 227 lb (103 kg), last menstrual period 12/27/2016, SpO2 98 %, unknown if currently breastfeeding.  Physical Exam:  General: alert, cooperative and no distress Lochia:normal flow Chest: no respiratory distress Heart:regular rate, distal pulses intact Abdomen: soft, nontender,  Uterine Fundus: firm, appropriately tender DVT Evaluation: No calf swelling or tenderness Extremities: no edema  Recent Labs    10/10/17 0910  HGB 12.2  HCT 36.9    Assessment/Plan:  ASSESSMENT: Cindy Mccullough is a 26 y.o. [id number] 1631w1d s/p VBAC.  Plan for discharge tomorrow   LOS: 2 days   Kyros Salzwedel MossMD 10/12/2017, 7:16 AM

## 2017-10-11 NOTE — Anesthesia Preprocedure Evaluation (Signed)
Anesthesia Evaluation  Patient identified by MRN, date of birth, ID band Patient awake    Reviewed: Allergy & Precautions, NPO status , Patient's Chart, lab work & pertinent test results  Airway Mallampati: II       Dental no notable dental hx. (+) Teeth Intact   Pulmonary neg pulmonary ROS,    Pulmonary exam normal breath sounds clear to auscultation       Cardiovascular negative cardio ROS Normal cardiovascular exam Rhythm:Regular Rate:Normal     Neuro/Psych negative neurological ROS  negative psych ROS   GI/Hepatic negative GI ROS, Neg liver ROS,   Endo/Other  Morbid obesity  Renal/GU negative Renal ROS  negative genitourinary   Musculoskeletal negative musculoskeletal ROS (+)   Abdominal (+) + obese,   Peds  Hematology negative hematology ROS (+)   Anesthesia Other Findings Day of surgery medications reviewed with the patient.  Reproductive/Obstetrics (+) Pregnancy                             Lab Results  Component Value Date   WBC 7.1 10/10/2017   HGB 12.2 10/10/2017   HCT 36.9 10/10/2017   MCV 87.0 10/10/2017   PLT 258 10/10/2017     Anesthesia Physical  Anesthesia Plan  ASA: III  Anesthesia Plan: Epidural   Post-op Pain Management:    Induction:   PONV Risk Score and Plan: 3 and Scopolamine patch - Pre-op, Midazolam, Ondansetron and Dexamethasone  Airway Management Planned: Natural Airway and Nasal Cannula  Additional Equipment:   Intra-op Plan:   Post-operative Plan:   Informed Consent: I have reviewed the patients History and Physical, chart, labs and discussed the procedure including the risks, benefits and alternatives for the proposed anesthesia with the patient or authorized representative who has indicated his/her understanding and acceptance.     Plan Discussed with: CRNA and Surgeon  Anesthesia Plan Comments:         Anesthesia Quick  Evaluation

## 2017-10-11 NOTE — Progress Notes (Signed)
Post Partum Day 0 Subjective: no complaints and patient confirms that she has signed medicaid tubal sterilization forms over 30 days ago, and confirms that she continues to desire permanent sterilization. Intended permanency of requested sterilization procedure is reviewed with the patient, and Filshie Clip sterilization technique briefly reviewed, mentioning again the permanency of sterilization, the permanency of the clip, and patient affirms the continued desire of permanent sterilization. Objective: Blood pressure 103/67, pulse 82, temperature 99 F (37.2 C), temperature source Oral, resp. rate 18, height 5\' 3"  (1.6 m), weight 227 lb (103 kg), last menstrual period 12/27/2016, SpO2 99 %, unknown if currently breastfeeding.  Physical Exam:  General: alert, cooperative and no distress Lochia: appropriate Uterine Fundus: firm at U-2 Incision: no recent incisions. S/p prior cesarean. DVT Evaluation: No evidence of DVT seen on physical exam.  Recent Labs    10/10/17 0910  HGB 12.2  HCT 36.9    Assessment/Plan: Contraception to be consented and scheduled for permanent sterilization by filshie clip   LOS: 1 day   Tilda BurrowJohn V Qasim Diveley 10/11/2017, 9:36 AM

## 2017-10-11 NOTE — Transfer of Care (Signed)
Immediate Anesthesia Transfer of Care Note  Patient: Cindy Mccullough  Procedure(s) Performed: POST PARTUM TUBAL LIGATION (Bilateral Abdomen)  Patient Location: PACU  Anesthesia Type:Epidural  Level of Consciousness: awake, alert , oriented, drowsy and patient cooperative  Airway & Oxygen Therapy: Patient Spontanous Breathing  Post-op Assessment: Report given to RN, Post -op Vital signs reviewed and stable and Patient able to stick tongue midline  Post vital signs: Reviewed and stable  Last Vitals:  Vitals:   10/11/17 0753 10/11/17 1150  BP: 103/67 (!) 106/52  Pulse: 82 90  Resp: 18 13  Temp: 37.2 C   SpO2:  95%    Last Pain:  Vitals:   10/11/17 1150  TempSrc:   PainSc: Asleep         Complications: No apparent anesthesia complications

## 2017-10-11 NOTE — Progress Notes (Signed)
Patient ID: Cindy Mccullough, female   DOB: 24-Nov-1991, 26 y.o.   MRN: [id number] Comfortable on epidural. Foley bulb out.  Vitals:   10/11/17 0000 10/11/17 0031 10/11/17 0101 10/11/17 0131  BP: (!) 101/44 (!) 92/52 115/62 117/66  Pulse: 95 85 78 75  Resp: 16 16 16 16   Temp:    98.1 F (36.7 C)  TempSrc:    Oral  SpO2:      Weight:      Height:       FHR stable but non reactive at present UCs every 3 min  Dilation: 7 Effacement (%): 70 Cervical Position: Middle Station: -1 Presentation: Vertex Exam by:: B Shella SpearingBoyer RN  Will consider AROM. Currently on Pitocin 12 units.

## 2017-10-11 NOTE — Op Note (Signed)
Please see the brief operative note for surgical details 

## 2017-10-11 NOTE — Brief Op Note (Signed)
10/11/2017  11:49 AM  PATIENT:  Cindy Mccullough  26 y.o. female  PRE-OPERATIVE DIAGNOSIS:  Desires Sterilization  POST-OPERATIVE DIAGNOSIS:  Desires Sterilization  PROCEDURE:  Procedure(s): POST PARTUM TUBAL LIGATION (Bilateral), Filshie clip placement  SURGEON:  Surgeon(s) and Role:    * Tilda BurrowFerguson, Damyia Strider V, MD - Primary  PHYSICIAN ASSISTANT:   ASSISTANTS: none   ANESTHESIA:   local and epidural  EBL:  25 mL   BLOOD ADMINISTERED:none  DRAINS: none   LOCAL MEDICATIONS USED:  MARCAINE    and Amount: 10 ml  SPECIMEN:  No Specimen  DISPOSITION OF SPECIMEN:  N/A  COUNTS:  YES  TOURNIQUET:  * No tourniquets in log *  DICTATION: .Dragon Dictation  PLAN OF CARE: Patient has admission orders ovary already  PATIENT DISPOSITION:  PACU - hemodynamically stable.   Delay start of Pharmacological VTE agent (>24hrs) due to surgical blood loss or risk of bleeding: not applicable Details of procedure: Patient was taken to the operating room prepped and draped for abdominal umbilical surgery with Foley catheter in place and timeout conducted with's procedure confirmed by operative team. No preoperative antibiotics were indicated. A infraumbilical semicircular 2 cm skin incision was made sharply dissecting through a very edematous loose subcutaneous fatty tissue finally identifying the fascia which we elevated with Allis clamps opened transversely 2 cm and then again very loose preperitoneal fat and encountered. Peritoneum couldn't be identified eventually elevated and opened very carefully revealing intra-abdominal contents, staring down the uterine fundus. The umbilicus could be rotated over to the right side the round ligament manipulated to allow visibility of the right adnexa where upon the right fallopian tube could be identified to its fimbriated end, traced back to mid position where a Filshie clip was placed difficulty. The left side was then addressed in similar fashion with  rotating of the umbilicus over to the patient's left, identifying the fimbria and then back tracking to midportion and the tube, identifying the ovary, then placing a Filshie clip in the midportion of the left fallopian tube. Hemostasis was good. Fascia was reidentified, closed in a running fashion with 0 Vicryl, followed by subcuticular 4-0 Vicryl closure the skin with good tissue approximation. Sponge and needle counts were correct and patient to recovery room in stable condition

## 2017-10-12 ENCOUNTER — Encounter (HOSPITAL_COMMUNITY): Payer: Self-pay | Admitting: Obstetrics and Gynecology

## 2017-10-12 ENCOUNTER — Encounter (HOSPITAL_COMMUNITY): Payer: Self-pay

## 2017-10-12 LAB — RPR, QUANT+TP ABS (REFLEX): TREPONEMA PALLIDUM AB: POSITIVE — AB

## 2017-10-12 LAB — RPR: RPR Ser Ql: REACTIVE — AB

## 2017-10-12 MED ORDER — IBUPROFEN 600 MG PO TABS: 600.0000 mg | ORAL_TABLET | Freq: Four times a day (QID) | ORAL | 0 refills | Status: AC

## 2017-10-12 NOTE — Addendum Note (Signed)
Addendum  created 10/12/17 0849 by Elgie CongoMalinova, Yarithza Mink H, CRNA   Charge Capture section accepted, Sign clinical note

## 2017-10-12 NOTE — Discharge Summary (Signed)
OB Discharge Summary     Patient Name: Cindy Mccullough DOB: 08-14-1991 MRN: [id number] Date of admission: 10/10/2017  Delivering MD: Lovena NeighboursIALLO, ABDOULAYE )  Date of discharge: 10/14/2017    Admitting diagnosis: postdates Intrauterine pregnancy: 1573w1d    Secondary diagnosis:  Active Problems:   Patient Active Problem List   Diagnosis Date Noted  . Encounter for sterilization postpartum Filshie clip placement 10/11/2017 10/11/2017  . Indication for care in labor or delivery 10/10/2017  . [redacted] weeks gestation of pregnancy   . Hereditary disease in family possibly affecting fetus, affecting management of mother, antepartum condition or complication, not applicable or unspecified fetus     Additional problems: previous c-section     Discharge diagnosis: Term Pregnancy Delivered and VBAC                                                                                                Post partum procedures: BTL  Complications: None  Hospital course:  Induction of Labor With Vaginal Delivery   [age] y.o. yo [id number] at 5573w1d was admitted to the hospital 10/10/2017 for induction of labor.  Indication for induction: Postdates.  Patient had an uncomplicated labor course as follows: Membrane Rupture Time/Date: 4:25 AM ,10/11/2017   Intrapartum Procedures: Episiotomy:                                           Lacerations:  2nd degree [3];Perineal [11]  Patient had delivery of a Viable infant.  Information for the patient's newborn:  Cindy Mccullough, Cindy Mccullough [[id number]][[id number]]  Delivery Method: VBAC, Spontaneous(Filed from Delivery Summary)   10/11/2017  Details of delivery can be found in separate delivery note.  Patient had a routine postpartum course. Patient is discharged home 10/14/17.  Physical exam  Vitals:   10/12/17 1856 10/13/17 0551  BP: (!) 107/56 116/60  Pulse: 78 74  Resp: 18 16  Temp: 97.7 F (36.5 C) 98.3 F (36.8 C)  SpO2:  97%    General: alert, cooperative and no  distress Lochia: appropriate Uterine Fundus: firm Incision: Healing well with no significant drainage DVT Evaluation: No evidence of DVT seen on physical exam.  Labs: No results found for this or any previous visit (from the past 24 hour(s)).   Discharge instruction: per After Visit Summary and "Baby and Me Booklet".  After visit meds:  Allergies  Allergen Reactions  . Soy Allergy Anaphylaxis    Difficulty breathing and eyes swell    Allergies as of 10/13/2017      Reactions   Soy Allergy Anaphylaxis   Difficulty breathing and eyes swell      Medication List    TAKE these medications   ibuprofen 600 MG tablet Commonly known as:  ADVIL,MOTRIN Take 1 tablet (600 mg total) every 6 (six) hours by mouth.   prenatal multivitamin Tabs tablet Take 1 tablet by mouth daily at 12 noon.        Diet: routine diet  Activity: Advance as tolerated. Pelvic rest  for 6 weeks.   Outpatient follow up:4 weeks Future Appointments: No future appointments.  Follow up Appt: No Follow-up on file.     Postpartum contraception: Tubal Ligation  Newborn Data: APGAR (1 MIN): 9   APGAR (5 MINS): 9      Baby Feeding: Breast Disposition:home with mother  Rolm Bookbindermber Delrose Rohwer, DO  10/14/2017

## 2017-10-12 NOTE — Anesthesia Postprocedure Evaluation (Signed)
Anesthesia Post Note  Patient: Cindy Mccullough  Procedure(s) Performed: AN AD HOC LABOR EPIDURAL     Patient location during evaluation: Mother Baby Anesthesia Type: Epidural Level of consciousness: awake and alert and oriented Pain management: pain level controlled Vital Signs Assessment: post-procedure vital signs reviewed and stable Respiratory status: spontaneous breathing and nonlabored ventilation Cardiovascular status: stable Postop Assessment: no headache, patient able to bend at knees, no backache, no apparent nausea or vomiting, epidural receding and adequate PO intake Anesthetic complications: no    Last Vitals:  Vitals:   10/11/17 2350 10/12/17 0330  BP: 109/71 92/60  Pulse: 79 69  Resp: 18 18  Temp: 36.4 C 36.6 C  SpO2: 100% 98%    Last Pain:  Vitals:   10/12/17 0619  TempSrc:   PainSc: 2    Pain Goal: Patients Stated Pain Goal: 3 (10/11/17 1645)               Donnalee CurryMalinova,Wretha Laris Hristova

## 2017-10-12 NOTE — Addendum Note (Signed)
Addendum  created 10/12/17 0848 by Elgie CongoMalinova, Yesenia Locurto H, CRNA   Sign clinical note

## 2017-10-12 NOTE — Discharge Instructions (Signed)

## 2017-10-12 NOTE — Anesthesia Postprocedure Evaluation (Signed)
Anesthesia Post Note  Patient: Cindy Mccullough  Procedure(s) Performed: POST PARTUM TUBAL LIGATION (Bilateral Abdomen)     Patient location during evaluation: Mother Baby Anesthesia Type: Epidural Level of consciousness: awake and alert and oriented Pain management: pain level controlled Vital Signs Assessment: post-procedure vital signs reviewed and stable Respiratory status: spontaneous breathing and nonlabored ventilation Cardiovascular status: stable Postop Assessment: no headache, patient able to bend at knees, no backache, epidural receding, adequate PO intake and no apparent nausea or vomiting Anesthetic complications: no    Last Vitals:  Vitals:   10/11/17 2350 10/12/17 0330  BP: 109/71 92/60  Pulse: 79 69  Resp: 18 18  Temp: 36.4 C 36.6 C  SpO2: 100% 98%    Last Pain:  Vitals:   10/12/17 0619  TempSrc:   PainSc: 2    Pain Goal: Patients Stated Pain Goal: 3 (10/11/17 1645)               Donnalee CurryMalinova,[name]

## 2017-10-13 NOTE — Lactation Note (Signed)
This note was copied from a baby's chart. Lactation Consultation Note  Patient Name: [name] ZOXWR'UToday's Date: 10/13/2017 Reason for consult: Follow-up assessment;Term;Nipple pain/trauma;Infant weight loss   Follow up with mom of 53 hour old infant. Infant with 5 BF for 10-20 minutes, formula x 5 of 10-30 cc, 2 voids and 5 stools in last 24 hours.  Infant weight 7 lb 12.3 oz with weight loss of 9%.   Mom reports she has been giving infant formula as she has sore nipples. MOm reports her nipples are cracked and she is using shells. Infant just fed 30 cc of formula and was asleep. Enc mom to call out for next feeding for assistance with latch.  Reviewed BF basics, cross cradle hold vs cradle hold, keeping infant close to breast with feedings, mom reports she has not been doing that. Showed her positions and how nose shold look at breast in the baby and me booklet.   Reviewed I/O Signs of dehydration in the infant, Engorgement prevention/treatment, Nipple care and breast milk expression and storage. Mom reports she has noticed knots in there breast that she is massaging out. Enc mom to put infant to the breast with each feeding and to offer Formula afterwards. Mom has a Free Me pump at home, enc mom to pump when not putting infant to the breast to stimulate milk production, prevent engorgement and use her EBM to supplement infant. Mom voiced understanding.   Reviewed LC Brochure, mom aware of OP services, BF Support Groups and LC phone #. Mom to call for questions/concerns prn. Mom reports all questions were answered and teaching was helpful for her.    Maternal Data Formula Feeding for Exclusion: No Has patient been taught Hand Expression?: Yes Does the patient have breastfeeding experience prior to this delivery?: Yes  Feeding Feeding Type: Bottle Fed - Formula Nipple Type: Slow - flow  LATCH Score                   Interventions Interventions: Breast feeding  basics reviewed;Support pillows;Position options;Expressed milk;Comfort gels  Lactation Tools Discussed/Used WIC Program: Yes   Consult Status Consult Status: Complete Follow-up type: Call as needed    Cindy BlalockSharon S Malani Mccullough 10/13/2017, 9:59 AM

## 2017-10-13 NOTE — Progress Notes (Signed)
CSW received consult due to score of 11 and sometimes to question 10 on Edinburgh Depression Screen.   MOB was quietly holding her sleeping baby when CSW arrived.  She was receptive to CSW's visit and seemed open to talking about how she is feeling.  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  She states she is [facility] K[facility] and finds talking with her beneficial.  She feels like the frequency of visits is sufficient at this time, but was open to information about mental health resources if she feels she needs increased frequency or if she feels medication would be beneficial at any time.  CSW informed MOB that antidepressants can take 4-6 weeks to reach a therapeutic level in the blood stream.  She states understanding.  She reports that she does not feel medication is needed at this time.  She states she sometimes feels like harming herself, but that she has not felt this way recently.  She states she felt this way mostly when she was a teenager, but that it happens occasionally.  MOB contracted for safety and states she has realized her self-worth over the years and now that she has children, sees them as her motivation to live.  She states she has sought mental health services when experiencing SI in the past and commits to reach out for help again in the future if ever needed.   CSW provided education regarding Baby Blues vs PMADs.  CSW encouraged MOB to evaluate her mental health throughout the postpartum period with the use of the New Mom Checklist developed by [employer], as well as the [place] Post[place] Depression Scale and notify a medical professional if symptoms arise.  MOB thanked CSW for talking with her.

## 2022-05-01 ENCOUNTER — Other Ambulatory Visit: Payer: Self-pay

## 2022-05-01 ENCOUNTER — Encounter (HOSPITAL_COMMUNITY): Payer: Self-pay | Admitting: Emergency Medicine

## 2022-05-01 ENCOUNTER — Emergency Department (HOSPITAL_COMMUNITY)
Admission: EM | Admit: 2022-05-01 | Discharge: 2022-05-02 | Disposition: A | Discharge status: Home / Self Care | Payer: Medicaid Other | Attending: Emergency Medicine | Admitting: Emergency Medicine

## 2022-05-01 ENCOUNTER — Emergency Department (HOSPITAL_COMMUNITY): Payer: Medicaid Other

## 2022-05-01 DIAGNOSIS — R0789 Other chest pain: Secondary | ICD-10-CM | POA: Insufficient documentation

## 2022-05-01 DIAGNOSIS — R079 Chest pain, unspecified: Secondary | ICD-10-CM | POA: Diagnosis present

## 2022-05-01 DIAGNOSIS — R110 Nausea: Secondary | ICD-10-CM | POA: Diagnosis not present

## 2022-05-01 LAB — CBC
HCT: 36.2 % (ref 36.0–46.0)
Hemoglobin: 11.3 g/dL — ABNORMAL LOW (ref 12.0–15.0)
MCH: 24.4 pg — ABNORMAL LOW (ref 26.0–34.0)
MCHC: 31.2 g/dL (ref 30.0–36.0)
MCV: 78 fL — ABNORMAL LOW (ref 80.0–100.0)
Platelets: 334 10*3/uL (ref 150–400)
RBC: 4.64 MIL/uL (ref 3.87–5.11)
RDW: 15.4 % (ref 11.5–15.5)
WBC: 10.1 10*3/uL (ref 4.0–10.5)
nRBC: 0 % (ref 0.0–0.2)

## 2022-05-01 NOTE — ED Triage Notes (Signed)
Pt reported to ED with c/o left sided chest pain that radiates into arm and neck. Pt states it feels as if her left arm is going numb. States pain has been present since approximately 2pm today. Denies nausea/vomiting or shortness of breath.

## 2022-05-02 LAB — TROPONIN I (HIGH SENSITIVITY)
Troponin I (High Sensitivity): 2 ng/L (ref <18)
Troponin I (High Sensitivity): <2 ng/L (ref <18)

## 2022-05-02 LAB — I-STAT BETA HCG BLOOD, ED (MC, WL, AP ONLY): I-stat hCG, quantitative: <5 m[IU]/mL (ref <5)

## 2022-05-02 LAB — BASIC METABOLIC PANEL
Anion gap: 9 (ref 5–15)
BUN: 11 mg/dL (ref 6–20)
CO2: 24 mmol/L (ref 22–32)
Calcium: 9.1 mg/dL (ref 8.9–10.3)
Chloride: 104 mmol/L (ref 98–111)
Creatinine, Ser: 0.72 mg/dL (ref 0.44–1.00)
GFR, Estimated: >60 mL/min (ref >60)
Glucose, Bld: 122 mg/dL — ABNORMAL HIGH (ref 70–99)
Potassium: 3.5 mmol/L (ref 3.5–5.1)
Sodium: 137 mmol/L (ref 135–145)

## 2022-05-02 MED ORDER — LIDOCAINE VISCOUS HCL 2 % MT SOLN
15.0000 mL | Freq: Once | OROMUCOSAL | Status: AC
  Administered 2022-05-02: 15 mL via ORAL
  Filled 2022-05-02: qty 15

## 2022-05-02 MED ORDER — PANTOPRAZOLE SODIUM 40 MG PO TBEC: 40.0000 mg | DELAYED_RELEASE_TABLET | Freq: Every day | ORAL | Status: DC

## 2022-05-02 MED ORDER — ALUM & MAG HYDROXIDE-SIMETH 200-200-20 MG/5ML PO SUSP
30.0000 mL | Freq: Once | ORAL | Status: AC
  Administered 2022-05-02: 30 mL via ORAL
  Filled 2022-05-02: qty 30

## 2022-05-02 MED ORDER — PANTOPRAZOLE SODIUM 40 MG PO TBEC: 40.0000 mg | DELAYED_RELEASE_TABLET | Freq: Every day | ORAL | 0 refills | Status: AC

## 2022-05-02 NOTE — ED Notes (Signed)
RN reviewed discharge instructions with pt. Pt verbalized understanding and had no further questions. VSS upon discharge.  

## 2022-05-02 NOTE — ED Provider Notes (Signed)
[facility] EMERGENCY DEPARTMENT Provider Note   CSN: [id number] Arrival date & time: 05/01/22  2241     History  Chief Complaint  Patient presents with   Chest Pain    Cindy Mccullough is a 31 y.o. female.  31 year old female presents today for evaluation of chest pain.  Pain started around 2 PM yesterday.  Denies prior cardiac history, or similar episodes of chest pain.  States symptoms started after she had lunch.  Has not attributed this to exertion.  Patient was resting when symptoms came on.  Denies diaphoresis, lightheadedness, chest pain.  Endorses nausea but without vomiting.  Without other complaints.  Denies significant family cardiac history.  Pain did radiate to her neck as well as her left upper extremity.  The history is provided by the patient. No language interpreter was used.       Home Medications Prior to Admission medications   Medication Sig Start Date End Date Taking? Authorizing Provider  ibuprofen (ADVIL,MOTRIN) 600 MG tablet Take 1 tablet (600 mg total) every 6 (six) hours by mouth. Patient not taking: Reported on 05/02/2022 10/13/17   Rolm Bookbinder, DO      Allergies    Soy allergy    Review of Systems   Review of Systems  Constitutional:  Negative for chills, diaphoresis and fever.  Respiratory:  Negative for shortness of breath.   Cardiovascular:  Positive for chest pain. Negative for palpitations and leg swelling.  Gastrointestinal:  Positive for nausea. Negative for abdominal pain and vomiting.  Neurological:  Negative for syncope, weakness and light-headedness.  All other systems reviewed and are negative.   Physical Exam Updated Vital Signs BP 106/60   Pulse 73   Temp 98 F (36.7 C) (Oral)   Resp 14   SpO2 98%  Physical Exam Vitals and nursing note reviewed.  Constitutional:      General: She is not in acute distress.    Appearance: Normal appearance. She is not ill-appearing.  HENT:     Head: Normocephalic and  atraumatic.     Nose: Nose normal.  Eyes:     General: No scleral icterus.    Extraocular Movements: Extraocular movements intact.     Conjunctiva/sclera: Conjunctivae normal.  Cardiovascular:     Rate and Rhythm: Normal rate and regular rhythm.     Pulses: Normal pulses.  Pulmonary:     Effort: Pulmonary effort is normal. No respiratory distress.     Breath sounds: Normal breath sounds. No wheezing or rales.  Abdominal:     General: There is no distension.     Tenderness: There is no abdominal tenderness.  Musculoskeletal:        General: Normal range of motion.     Cervical back: Normal range of motion.     Right lower leg: No edema.     Left lower leg: No edema.  Skin:    General: Skin is warm and dry.  Neurological:     General: No focal deficit present.     Mental Status: She is alert. Mental status is at baseline.     ED Results / Procedures / Treatments   Labs (all labs ordered are listed, but only abnormal results are displayed) Labs Reviewed  BASIC METABOLIC PANEL - Abnormal; Notable for the following components:      Result Value   Glucose, Bld 122 (*)    All other components within normal limits  CBC - Abnormal; Notable for the following components:  Hemoglobin 11.3 (*)    MCV 78.0 (*)    MCH 24.4 (*)    All other components within normal limits  I-STAT BETA HCG BLOOD, ED ([facility], [facility], [facility] ONLY)  TROPONIN I (HIGH SENSITIVITY)  TROPONIN I (HIGH SENSITIVITY)    EKG None  Radiology DG Chest 2 View  Result Date: 05/01/2022 CLINICAL DATA:  Chest pain. EXAM: CHEST - 2 VIEW COMPARISON:  None Available. FINDINGS: The heart size and mediastinal contours are within normal limits. Both lungs are clear. No acute osseous abnormality. IMPRESSION: No active cardiopulmonary disease. Electronically Signed   By: Thornell Sartorius M.D.   On: 05/01/2022 23:37    Procedures Procedures    Medications Ordered in ED Medications  pantoprazole (PROTONIX) EC tablet 40 mg (has no  administration in time range)  alum & mag hydroxide-simeth (MAALOX/MYLANTA) 200-200-20 MG/5ML suspension 30 mL (30 mLs Oral Given 05/02/22 0528)    And  lidocaine (XYLOCAINE) 2 % viscous mouth solution 15 mL (15 mLs Oral Given 05/02/22 7989)    ED Course/ Medical Decision Making/ A&P                           Medical Decision Making Amount and/or Complexity of Data Reviewed Labs: ordered. Radiology: ordered.  Risk OTC drugs. Prescription drug management.   Medical Decision Making / ED Course   This patient presents to the ED for concern of chest pain, this involves an extensive number of treatment options, and is a complaint that carries with it a high risk of complications and morbidity.  The differential diagnosis includes ACS, pneumonia, PE, GERD, MSK pain  MDM: 31 year old female presents today for evaluation of chest pain that started around 2 PM yesterday.  Work-up overall reassuring.  However her HPI is somewhat concerning given that radiated to her neck as well as left upper extremity.  Did not have any complete resolution of pain prior to arrival.  She did have improvement in her chest pain following GI cocktail.  Will prescribe Protonix.  CBC without leukocytosis.  Hemoglobin 11.3 which is around her baseline.  BMP unremarkable.  Troponin negative x2.  Chest x-ray without acute cardiopulmonary process.  EKG with T wave inversions in lead V1 and V2 otherwise without concern.  Doubt ACS.  However given she is obese, and her story I will provide her with referral to cardiology.  Low risk for PE.  PERC negative.  Discussed importance of follow-up with PCP.  Return precautions discussed.  Patient voices understanding and is in agreement with plan.   Lab Tests: -I ordered, reviewed, and interpreted labs.   The pertinent results include:   Labs Reviewed  BASIC METABOLIC PANEL - Abnormal; Notable for the following components:      Result Value   Glucose, Bld 122 (*)    All other  components within normal limits  CBC - Abnormal; Notable for the following components:   Hemoglobin 11.3 (*)    MCV 78.0 (*)    MCH 24.4 (*)    All other components within normal limits  I-STAT BETA HCG BLOOD, ED ([facility], [facility], [facility] ONLY)  TROPONIN I (HIGH SENSITIVITY)  TROPONIN I (HIGH SENSITIVITY)      EKG  EKG Interpretation  Date/Time:    Ventricular Rate:    PR Interval:    QRS Duration:   QT Interval:    QTC Calculation:   R Axis:     Text Interpretation:  Imaging Studies ordered: I ordered imaging studies including chest x-ray I independently visualized and interpreted imaging. I agree with the radiologist interpretation   Medicines ordered and prescription drug management: Meds ordered this encounter  Medications   AND Linked Order Group    alum & mag hydroxide-simeth (MAALOX/MYLANTA) 200-200-20 MG/5ML suspension 30 mL    lidocaine (XYLOCAINE) 2 % viscous mouth solution 15 mL   pantoprazole (PROTONIX) EC tablet 40 mg    -I have reviewed the patients home medicines and have made adjustments as needed  Cardiac Monitoring: The patient was maintained on a cardiac monitor.  I personally viewed and interpreted the cardiac monitored which showed an underlying rhythm of: Normal sinus rhythm  Reevaluation: After the interventions noted above, I reevaluated the patient and found that they have :improved  Co morbidities that complicate the patient evaluation  Past Medical History:  Diagnosis Date   Medical history non-contributory       Dispostion: Patient is appropriate for discharge.  Discharged in stable condition.  Return precautions discussed.  Patient voices understanding and is in agreement with plan.  Final Clinical Impression(s) / ED Diagnoses Final diagnoses:  Atypical chest pain    Rx / DC Orders ED Discharge Orders          Ordered    pantoprazole (PROTONIX) 40 MG tablet  Daily        05/02/22 0640              Marita KansasAli, Collen Vincent,  PA-C 05/02/22 [id number]    Nira Connardama, Pedro Eduardo, MD 05/02/22 [phone]

## 2022-05-02 NOTE — Discharge Instructions (Addendum)
Your work-up today was reassuring.  Your heart enzymes were normal.  Your EKG and x-ray did not show any significant concerns.  Your symptoms did improve following taking the GI cocktail.  I have sent Protonix into the pharmacy for you.  This will help if your symptoms are mediated by acid reflux.  Your story still concerning for cardiac chest pain so I have given you a referral to cardiology clinic.  Please follow-up with your PCP and if you do not have 1 I have attached [facility] to establish care.  Please also call cardiology to establish follow-up with.  If you have any worsening symptoms please return to the emergency room.

## 2022-05-13 ENCOUNTER — Encounter: Payer: Self-pay | Admitting: *Deleted
